# Patient Record
Sex: Male | Born: 1980 | Race: Black or African American | Hispanic: No | Marital: Single | Smoking: Current every day smoker
Health system: Southern US, Community
[De-identification: ages and names within clinical notes are randomized; demographics above are authoritative.]

---

## 2006-01-13 ENCOUNTER — Emergency Department (HOSPITAL_COMMUNITY): Admission: EM | Admit: 2006-01-13 | Discharge: 2006-01-13 | Payer: Self-pay | Admitting: Emergency Medicine

## 2006-02-17 ENCOUNTER — Emergency Department (HOSPITAL_COMMUNITY): Admission: EM | Admit: 2006-02-17 | Discharge: 2006-02-17 | Payer: Self-pay | Admitting: Family Medicine

## 2007-02-20 ENCOUNTER — Emergency Department (HOSPITAL_COMMUNITY): Admission: EM | Admit: 2007-02-20 | Discharge: 2007-02-20 | Payer: Self-pay | Admitting: Emergency Medicine

## 2008-05-03 ENCOUNTER — Emergency Department (HOSPITAL_COMMUNITY): Admission: EM | Admit: 2008-05-03 | Discharge: 2008-05-03 | Payer: Self-pay | Admitting: Family Medicine

## 2010-05-17 LAB — GC/CHLAMYDIA PROBE AMP, GENITAL: Chlamydia, DNA Probe: NEGATIVE

## 2011-06-27 ENCOUNTER — Emergency Department (HOSPITAL_BASED_OUTPATIENT_CLINIC_OR_DEPARTMENT_OTHER): Payer: BC Managed Care – PPO

## 2011-06-27 ENCOUNTER — Encounter (HOSPITAL_BASED_OUTPATIENT_CLINIC_OR_DEPARTMENT_OTHER): Payer: Self-pay | Admitting: *Deleted

## 2011-06-27 ENCOUNTER — Emergency Department (HOSPITAL_BASED_OUTPATIENT_CLINIC_OR_DEPARTMENT_OTHER)
Admission: EM | Admit: 2011-06-27 | Discharge: 2011-06-27 | Disposition: A | Discharge status: Home / Self Care | Payer: BC Managed Care – PPO | Attending: Emergency Medicine | Admitting: Emergency Medicine

## 2011-06-27 DIAGNOSIS — R0789 Other chest pain: Secondary | ICD-10-CM

## 2011-06-27 DIAGNOSIS — F17200 Nicotine dependence, unspecified, uncomplicated: Secondary | ICD-10-CM | POA: Insufficient documentation

## 2011-06-27 DIAGNOSIS — R079 Chest pain, unspecified: Secondary | ICD-10-CM | POA: Insufficient documentation

## 2011-06-27 DIAGNOSIS — J40 Bronchitis, not specified as acute or chronic: Secondary | ICD-10-CM

## 2011-06-27 DIAGNOSIS — R062 Wheezing: Secondary | ICD-10-CM | POA: Insufficient documentation

## 2011-06-27 LAB — BASIC METABOLIC PANEL
BUN: 19 mg/dL (ref 6–23)
CO2: 25 mEq/L (ref 19–32)
Chloride: 98 mEq/L (ref 96–112)
GFR calc non Af Amer: >90 mL/min (ref >90)
Glucose, Bld: 89 mg/dL (ref 70–99)
Potassium: 3.8 mEq/L (ref 3.5–5.1)
Sodium: 135 mEq/L (ref 135–145)

## 2011-06-27 LAB — DIFFERENTIAL
Basophils Relative: 0 % (ref 0–1)
Eosinophils Relative: 1 % (ref 0–5)
Lymphs Abs: 2.1 10*3/uL (ref 0.7–4.0)
Monocytes Absolute: 0.7 10*3/uL (ref 0.1–1.0)
Monocytes Relative: 9 % (ref 3–12)
Neutro Abs: 4.5 10*3/uL (ref 1.7–7.7)

## 2011-06-27 LAB — CBC
HCT: 40 % (ref 39.0–52.0)
MCH: 23.9 pg — ABNORMAL LOW (ref 26.0–34.0)
MCV: 68.3 fL — ABNORMAL LOW (ref 78.0–100.0)
Platelets: 176 10*3/uL (ref 150–400)
RBC: 5.86 MIL/uL — ABNORMAL HIGH (ref 4.22–5.81)
WBC: 7.4 10*3/uL (ref 4.0–10.5)

## 2011-06-27 MED ORDER — ALBUTEROL SULFATE (5 MG/ML) 0.5% IN NEBU
5.0000 mg | INHALATION_SOLUTION | Freq: Once | RESPIRATORY_TRACT | Status: AC
  Administered 2011-06-27: 5 mg via RESPIRATORY_TRACT
  Filled 2011-06-27: qty 1

## 2011-06-27 MED ORDER — KETOROLAC TROMETHAMINE 30 MG/ML IJ SOLN
30.0000 mg | Freq: Once | INTRAMUSCULAR | Status: AC
  Administered 2011-06-27: 30 mg via INTRAVENOUS
  Filled 2011-06-27: qty 1

## 2011-06-27 MED ORDER — IBUPROFEN 600 MG PO TABS: 600.0000 mg | ORAL_TABLET | Freq: Four times a day (QID) | ORAL | Status: AC | PRN

## 2011-06-27 MED ORDER — ALBUTEROL SULFATE HFA 108 (90 BASE) MCG/ACT IN AERS: 1.0000 | INHALATION_SPRAY | Freq: Four times a day (QID) | RESPIRATORY_TRACT | Status: DC | PRN

## 2011-06-27 NOTE — Discharge Instructions (Signed)

## 2011-06-27 NOTE — ED Provider Notes (Signed)
History     CSN: [id number]  Arrival date & time 06/27/11  0010   First MD Initiated Contact with Patient 06/27/11 0031      Chief Complaint  Patient presents with  . Chest Pain  . Back Pain    (Consider location/radiation/quality/duration/timing/severity/associated sxs/prior treatment) Patient is a 31 y.o. male presenting with chest pain. The history is provided by the patient.  Chest Pain The chest pain began 2 days ago. Chest pain occurs constantly. The chest pain is unchanged. The pain is associated with lifting. Quality: spasmotic. The pain does not radiate. Primary symptoms include wheezing. Pertinent negatives for primary symptoms include no palpitations.  Pertinent negatives for associated symptoms include no claudication, no diaphoresis and no lower extremity edema. He tried nothing for the symptoms. Risk factors include male gender.  Pertinent negatives for past medical history include no Marfan's syndrome.  Pertinent negatives for family medical history include: no Marfan's syndrome in family.  Procedure history is negative for cardiac catheterization.   Lifts for work and drinks lots of energy drinks and mountain dew.  Worse with lifting.  No DOE.  No n/v/d.    History reviewed. No pertinent past medical history.  History reviewed. No pertinent past surgical history.  History reviewed. No pertinent family history.  History  Substance Use Topics  . Smoking status: Current Everyday Smoker  . Smokeless tobacco: Not on file  . Alcohol Use: No      Review of Systems  Constitutional: Negative for diaphoresis.  Respiratory: Positive for wheezing.   Cardiovascular: Negative for palpitations, claudication and leg swelling.  All other systems reviewed and are negative.    Allergies  Review of patient's allergies indicates no known allergies.  Home Medications  No current outpatient prescriptions on file.  BP 147/95  Pulse 61  Temp(Src) 97.9 F (36.6 C)  (Oral)  Resp 18  Ht 5\' 9"  (1.753 m)  Wt 160 lb (72.576 kg)  BMI 23.63 kg/m2  SpO2 100%  Physical Exam  Constitutional: He is oriented to person, place, and time. He appears well-developed and well-nourished.  HENT:  Head: Normocephalic and atraumatic.  Mouth/Throat: Oropharynx is clear and moist.  Eyes: Conjunctivae are normal. Pupils are equal, round, and reactive to light.  Neck: Normal range of motion. Neck supple.  Cardiovascular: Normal rate and regular rhythm.   Pulmonary/Chest: He has wheezes. He exhibits no tenderness.  Abdominal: Soft. There is no tenderness.  Musculoskeletal: Normal range of motion. He exhibits no edema.       Negative NEER tests of B shoulders but pushing on shoulders replicates pain  Neurological: He is alert and oriented to person, place, and time. He has normal reflexes.  Skin: Skin is warm and dry.  Psychiatric: He has a normal mood and affect.    ED Course  Procedures (including critical care time)   Labs Reviewed  CBC  DIFFERENTIAL  BASIC METABOLIC PANEL  TROPONIN I   No results found.   No diagnosis found.    MDM  PERC negative.  One troponin is sufficient to r/o ACS in setting of > 8 hrs of pain.  Pain is typical of muscle spasms.  Likely due to to his type of work.      Date: 06/27/2011  Rate: 58  Rhythm: normal sinus rhythm  QRS Axis: right  Intervals: normal  ST/T Wave abnormalities: normal  Conduction Disutrbances:none  Narrative Interpretation:   Old EKG Reviewed: none available     Stop smoking, no energy  drinks, take all medication on full stomach.  Return for wheezing cough, fevers, chest pain or any concerns.  Follow up with your PMD.  Patient verbalizes understanding and agrees to follow up  Domanique Luckett Smitty Cords, MD 06/27/11 [id number]

## 2011-06-27 NOTE — ED Notes (Signed)
Pt c/o heavy lifting at work with left side chest wall pain and bil shoulder paina and back pain

## 2014-12-21 ENCOUNTER — Encounter (HOSPITAL_COMMUNITY): Payer: Self-pay | Admitting: Emergency Medicine

## 2014-12-21 ENCOUNTER — Emergency Department (HOSPITAL_COMMUNITY): Payer: Self-pay

## 2014-12-21 ENCOUNTER — Emergency Department (HOSPITAL_COMMUNITY)
Admission: EM | Admit: 2014-12-21 | Discharge: 2014-12-21 | Disposition: A | Discharge status: Home / Self Care | Payer: Self-pay | Attending: Emergency Medicine | Admitting: Emergency Medicine

## 2014-12-21 DIAGNOSIS — S199XXA Unspecified injury of neck, initial encounter: Secondary | ICD-10-CM | POA: Insufficient documentation

## 2014-12-21 DIAGNOSIS — F10920 Alcohol use, unspecified with intoxication, uncomplicated: Secondary | ICD-10-CM

## 2014-12-21 DIAGNOSIS — Y92410 Unspecified street and highway as the place of occurrence of the external cause: Secondary | ICD-10-CM | POA: Insufficient documentation

## 2014-12-21 DIAGNOSIS — Z79899 Other long term (current) drug therapy: Secondary | ICD-10-CM | POA: Insufficient documentation

## 2014-12-21 DIAGNOSIS — F17200 Nicotine dependence, unspecified, uncomplicated: Secondary | ICD-10-CM | POA: Insufficient documentation

## 2014-12-21 DIAGNOSIS — S20229A Contusion of unspecified back wall of thorax, initial encounter: Secondary | ICD-10-CM | POA: Insufficient documentation

## 2014-12-21 DIAGNOSIS — Y998 Other external cause status: Secondary | ICD-10-CM | POA: Insufficient documentation

## 2014-12-21 DIAGNOSIS — F10120 Alcohol abuse with intoxication, uncomplicated: Secondary | ICD-10-CM | POA: Insufficient documentation

## 2014-12-21 DIAGNOSIS — Y9389 Activity, other specified: Secondary | ICD-10-CM | POA: Insufficient documentation

## 2014-12-21 LAB — SAMPLE TO BLOOD BANK

## 2014-12-21 LAB — COMPREHENSIVE METABOLIC PANEL
ALT: 27 U/L (ref 17–63)
AST: 35 U/L (ref 15–41)
Albumin: 4.1 g/dL (ref 3.5–5.0)
Alkaline Phosphatase: 86 U/L (ref 38–126)
Anion gap: 10 (ref 5–15)
BILIRUBIN TOTAL: 0.6 mg/dL (ref 0.3–1.2)
BUN: 5 mg/dL — AB (ref 6–20)
CO2: 26 mmol/L (ref 22–32)
CREATININE: 1.2 mg/dL (ref 0.61–1.24)
Calcium: 9 mg/dL (ref 8.9–10.3)
Chloride: 102 mmol/L (ref 101–111)
Glucose, Bld: 134 mg/dL — ABNORMAL HIGH (ref 65–99)
POTASSIUM: 3.3 mmol/L — AB (ref 3.5–5.1)
Sodium: 138 mmol/L (ref 135–145)
TOTAL PROTEIN: 7 g/dL (ref 6.5–8.1)

## 2014-12-21 LAB — CBC
HCT: 42.7 % (ref 39.0–52.0)
Hemoglobin: 13.9 g/dL (ref 13.0–17.0)
MCH: 25.1 pg — ABNORMAL LOW (ref 26.0–34.0)
MCHC: 32.6 g/dL (ref 30.0–36.0)
MCV: 77.2 fL — ABNORMAL LOW (ref 78.0–100.0)
PLATELETS: 173 10*3/uL (ref 150–400)
RBC: 5.53 MIL/uL (ref 4.22–5.81)
RDW: 15.6 % — AB (ref 11.5–15.5)
WBC: 6.9 10*3/uL (ref 4.0–10.5)

## 2014-12-21 LAB — ETHANOL: ALCOHOL ETHYL (B): 221 mg/dL — AB (ref <5)

## 2014-12-21 MED ORDER — TRAMADOL HCL 50 MG PO TABS: 50.0000 mg | ORAL_TABLET | Freq: Four times a day (QID) | ORAL | Status: DC | PRN

## 2014-12-21 MED ORDER — IOHEXOL 300 MG/ML  SOLN
100.0000 mL | Freq: Once | INTRAMUSCULAR | Status: AC | PRN
Start: 2014-12-21 — End: 2014-12-21
  Administered 2014-12-21: 100 mL via INTRAVENOUS

## 2014-12-21 NOTE — ED Notes (Signed)
Pt refusing to wear c-collar. Pt took collar off.
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
## 2014-12-21 NOTE — ED Notes (Signed)
Pt left with all his belongings and ambulated out of the treatment area.  
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
## 2017-06-02 ENCOUNTER — Ambulatory Visit: Payer: Self-pay

## 2017-06-02 NOTE — Telephone Encounter (Signed)
Pt calling to ask what can be done for pain for left little (pinkie) finger. On Saturday, pt was playing basketball and when he went up for a rebound, he jammed his finger against another players shoulder.  Pt stated there is edema to finger and to the left side of the left hand. Bruising is noted to the distal joint to the nailbed and mid medial finger per pt. Pain is a 3/10 when at rest and with movement pain goes to a 8/10. No breaks in the skin.  Pt has appt with Marco Collie PA tomorrow am. Care advice given per protocol.  Reason for Disposition . Finger joint can't be opened (straightened) or closed (bent) completely    (Note: injured person should be able to do this without assistance)  Answer Assessment - Initial Assessment Questions 1. MECHANISM: "How did the injury happen?"      Playing basketball went up for a rebound and felt like he jammed it against a players shoulder- feels like he broke the distal bone on his pinky finger 2. ONSET: "When did the injury happen?" (Minutes or hours ago)  Saturday 3. LOCATION: "What part of the finger is injured?" "Is the nail damaged?"      Distal pinky figer 4. APPEARANCE of the INJURY: "What does the injury look like?"      Bruise (purplish)mid finger, discoloration or bruise to finger nail 5. SEVERITY: "Can you use the hand normally?"  "Can you bend your fingers into a ball and then fully open them?"cannot use the hand normally and cannot bend finger into a ball      6. SIZE: For cuts, bruises, or swelling, ask: "How large is it?" (e.g., inches or centimeters;  entire finger)      Entire pinky finger is swollen and down to the wrist 7. PAIN: "Is there pain?" If so, ask: "How bad is the pain?"    (e.g., Scale 1-10; or mild, moderate, severe)     Yes pain is 8/10 if hitting on the right spot at rest is a 3/10 8. TETANUS: For any breaks in the skin, ask: "When was the last tetanus booster?" No breaks in skin 9. OTHER SYMPTOMS: "Do you have any  other symptoms?"     no 10. PREGNANCY: "Is there any chance you are pregnant?" "When was your last menstrual period?"       n/a  Protocols used: FINGER INJURY-A-AH

## 2017-06-03 ENCOUNTER — Other Ambulatory Visit: Payer: Self-pay

## 2017-06-03 ENCOUNTER — Ambulatory Visit (INDEPENDENT_AMBULATORY_CARE_PROVIDER_SITE_OTHER): Payer: Managed Care, Other (non HMO) | Admitting: Physician Assistant

## 2017-06-03 ENCOUNTER — Ambulatory Visit (INDEPENDENT_AMBULATORY_CARE_PROVIDER_SITE_OTHER): Payer: Managed Care, Other (non HMO)

## 2017-06-03 ENCOUNTER — Encounter: Payer: Self-pay | Admitting: Physician Assistant

## 2017-06-03 VITALS — BP 112/70 | HR 57 | Temp 98.0°F | Resp 16 | Ht 69.69 in | Wt 160.0 lb

## 2017-06-03 DIAGNOSIS — M79645 Pain in left finger(s): Secondary | ICD-10-CM

## 2017-06-03 MED ORDER — MELOXICAM 15 MG PO TABS: 15.0000 mg | ORAL_TABLET | Freq: Every day | ORAL | 1 refills | Status: AC

## 2017-06-03 NOTE — Patient Instructions (Addendum)
  Your x-ray does not show a fracture.  Keep the splint on your finger for the next 2 weeks.  Take Meloxicam once daily. This is an NSIAD. Do not use with any other otc pain medication other than tylenol/acetaminophen - so no aleve, ibuprofen, motrin, advil, etc. Rest and elevate the affected painful area.  Apply cold compresses intermittently as needed.  As pain recedes, begin normal activities slowly as tolerated.   You will receive a phone call to schedule an appointment with sports medicine - they will be able to see if there is an injury to your ligaments. Please make this appointment when you get the call.   Come back and see me in 3-4 weeks for follow-up and return to work note.   Thank you for coming in today. I hope you feel we met your needs.  Feel free to call PCP if you have any questions or further requests.  Please consider signing up for MyChart if you do not already have it, as this is a great way to communicate with me.  Best,  Whitney McVey, PA-C  IF you received an x-ray today, you will receive an invoice from [facility]. Please contact [facility] at [phone] with questions or concerns regarding your invoice.   IF you received labwork today, you will receive an invoice from [place]. Please contact [facility] at [phone] with questions or concerns regarding your invoice.   Our billing staff will not be able to assist you with questions regarding bills from these companies.  You will be contacted with the lab results as soon as they are available. The fastest way to get your results is to activate your My Chart account. Instructions are located on the last page of this paperwork. If you have not heard from [place] regarding the results in 2 weeks, please contact this office.

## 2017-06-03 NOTE — Progress Notes (Signed)
   MICHARL HELMES  MRN: [id number] DOB: 1980-02-17  PCP: Patient, No Pcp Per  Subjective:  Pt is a 37 year old male who presents to clinic for finger pain x 3 days. He hurt his left pinky while playing basketball and jammed it into something. Pain is of the distal joint    Review of Systems  Musculoskeletal: Positive for arthralgias (finger pain) and joint swelling.  Skin: Positive for color change.  Neurological: Negative for weakness and numbness.    There are no active problems to display for this patient.   No current outpatient medications on file prior to visit.   No current facility-administered medications on file prior to visit.     No Known Allergies   Objective:  BP 112/70   Pulse (!) 57   Temp 98 F (36.7 C) (Oral)   Resp 16   Ht 5' 9.69" (1.77 m)   Wt 160 lb (72.6 kg)   SpO2 100%   BMI 23.17 kg/m   Physical Exam  Constitutional: He is oriented to person, place, and time. He appears well-developed and well-nourished.  Musculoskeletal:       Hands: Neurological: He is alert and oriented to person, place, and time. No sensory deficit.  Skin: Skin is warm and dry.  Psychiatric: He has a normal mood and affect. His behavior is normal. Judgment and thought content normal.  Vitals reviewed.   Dg Finger Little Left  Result Date: 06/03/2017 CLINICAL DATA:  Pain in the DIP joint for the past 3 days. History of a jamming injury. EXAM: LEFT LITTLE FINGER 2+V COMPARISON:  None in PACs FINDINGS: The left fifth finger is subjectively adequately mineralized. There is no acute or healing fracture. The joint spaces are reasonably well-maintained. There is a well-marginated erosion or subcortical cyst along the dorso lateral aspect of the base of the proximal phalanx. IMPRESSION: There is no acute post traumatic injury of the fifth finger. Electronically Signed   By: David  [place] M.D.   On: 06/03/2017 11:00    Assessment and Plan :  1. Pain of finger of left hand - DG  Finger Little Left; Future - Ambulatory referral to Sports Medicine - meloxicam (MOBIC) 15 MG tablet; Take 1 tablet (15 mg total) by mouth daily.  Dispense: 30 tablet; Refill: 1 - pt presents with pain and swelling of DIP of 5th finger s/p jamming injury. X-ray is negative. Suspect possible mild mallet finger. Plan to splint x 3-4 weeks. Splint applied by CMA immobilizing DIP and PIP joint. Advised PRICE principles. 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  F/u in 3-4 weeks.   Marco Collie, PA-C  Primary Care at [facility] 06/03/2017 10:43 AM

## 2017-06-06 ENCOUNTER — Encounter: Payer: Self-pay | Admitting: General Practice

## 2018-12-23 ENCOUNTER — Emergency Department (HOSPITAL_COMMUNITY)
Admission: EM | Admit: 2018-12-23 | Discharge: 2018-12-23 | Disposition: A | Discharge status: Home / Self Care | Payer: Self-pay | Attending: Emergency Medicine | Admitting: Emergency Medicine

## 2018-12-23 ENCOUNTER — Encounter (HOSPITAL_COMMUNITY): Payer: Self-pay

## 2018-12-23 DIAGNOSIS — R112 Nausea with vomiting, unspecified: Secondary | ICD-10-CM | POA: Insufficient documentation

## 2018-12-23 DIAGNOSIS — F17210 Nicotine dependence, cigarettes, uncomplicated: Secondary | ICD-10-CM | POA: Insufficient documentation

## 2018-12-23 DIAGNOSIS — R5383 Other fatigue: Secondary | ICD-10-CM | POA: Insufficient documentation

## 2018-12-23 DIAGNOSIS — F1210 Cannabis abuse, uncomplicated: Secondary | ICD-10-CM | POA: Insufficient documentation

## 2018-12-23 LAB — CBG MONITORING, ED: Glucose-Capillary: 97 mg/dL (ref 70–99)

## 2018-12-23 MED ORDER — ONDANSETRON 4 MG PO TBDP: 4.0000 mg | ORAL_TABLET | Freq: Three times a day (TID) | ORAL | 0 refills | Status: AC | PRN

## 2018-12-23 NOTE — ED Provider Notes (Signed)
[facility]-EMERGENCY DEPT Provider Note   CSN: [id number] Arrival date & time: 12/23/18  1036     History   Chief Complaint Chief Complaint  Patient presents with  . Emesis  . Fatigue    HPI Alexander Stevenson is a 38 y.o. male with no past medical history who presents to the ED after 1 episode of non-bloody, non-bilious emesis that occurred yesterday after eating [employer]. Patient notes he has needed "fluid shots" before for dehydration. Patient states he feels "weak on his stomach due to vomiting yesterday" but has no other complaints. He uses energy drinks daily for caffeine and notes he has had 2 energy drinks today. Patient was able to eat half a sandwich today without any difficulty. He denies further episodes of emesis. Patient denies abdominal pain, diarrhea, nausea, chest pain, and shortness of breath.  History reviewed. No pertinent past medical history.  There are no active problems to display for this patient.   History reviewed. No pertinent surgical history.      Home Medications    Prior to Admission medications   Medication Sig Start Date End Date Taking? Authorizing Provider  meloxicam (MOBIC) 15 MG tablet Take 1 tablet (15 mg total) by mouth daily. 06/03/17   McVey, Madelaine Bhat, PA-C  ondansetron (ZOFRAN ODT) 4 MG disintegrating tablet Take 1 tablet (4 mg total) by mouth every 8 (eight) hours as needed for nausea or vomiting. 12/23/18   Renee Harder, PA-C    Family History No family history on file.  Social History Social History   Tobacco Use  . Smoking status: Current Every Day Smoker  . Smokeless tobacco: Never Used  Substance Use Topics  . Alcohol use: No  . Drug use: Yes    Types: Marijuana     Allergies   Patient has no known allergies.   Review of Systems Review of Systems  Constitutional: Negative for chills and fever.  Respiratory: Negative for cough and shortness of breath.   Cardiovascular: Negative for  chest pain.  Gastrointestinal: Positive for vomiting (1 episode yesterday). Negative for abdominal pain, diarrhea and nausea.     Physical Exam Updated Vital Signs BP (!) 148/61 (BP Location: Left Arm)   Pulse (!) 58   Temp 98.2 F (36.8 C) (Oral)   Resp 17   SpO2 99%   Physical Exam Vitals signs and nursing note reviewed.  Constitutional:      General: He is not in acute distress.    Appearance: He is not ill-appearing.  HENT:     Head: Normocephalic.     Nose: Nose normal.     Mouth/Throat:     Mouth: Mucous membranes are moist.     Pharynx: No oropharyngeal exudate or posterior oropharyngeal erythema.     Comments: Moist mucus membranes Eyes:     Conjunctiva/sclera: Conjunctivae normal.  Neck:     Musculoskeletal: Normal range of motion and neck supple.  Cardiovascular:     Rate and Rhythm: Normal rate and regular rhythm.     Pulses: Normal pulses.     Heart sounds: Normal heart sounds. No murmur. No friction rub. No gallop.   Pulmonary:     Effort: Pulmonary effort is normal.     Breath sounds: Normal breath sounds.  Abdominal:     General: Abdomen is flat. There is no distension.     Palpations: Abdomen is soft.     Tenderness: There is no abdominal tenderness. There is no guarding or  rebound.  Musculoskeletal:     Comments: Able to move all 4 extremities without difficulty  Skin:    General: Skin is warm.     Capillary Refill: Capillary refill takes less than 2 seconds.     Coloration: Skin is not pale.  Neurological:     General: No focal deficit present.     Mental Status: He is alert.      ED Treatments / Results  Labs (all labs ordered are listed, but only abnormal results are displayed) Labs Reviewed  CBG MONITORING, ED    EKG None  Radiology No results found.  Procedures Procedures (including critical care time)  Medications Ordered in ED Medications - No data to display   Initial Impression / Assessment and Plan / ED Course  I  have reviewed the triage vital signs and the nursing notes.  Pertinent labs & imaging results that were available during my care of the patient were reviewed by me and considered in my medical decision making (see chart for details).       38 year old male presents to ED after 1 episode of non-bloody, non-bilious emesis that occurred yesterday. Vitals reviewed. Patient is bradycardic at 59 which appears to be his baseline through chart review. Patient has an elevated BP of 148/61; however blood pressure cuff is over sweatshirt. Repeat BP was 138/71. Will continue to monitor. Abdomen soft, non-distended, and non-tender. Mucus membranes are moist with no signs of dehydration. Capillary refill <2. Skin turgor normal. Patient able to ambulate in ED without difficulty. Will start with po fluids and reevaluate patient.  Patient tolerates fluids without difficulty. Will discharge patient home with zofran as needed for nausea. PCP number given to patient to establish care. Strict ED precautions discussed with patient. Patient states understanding and agrees to plan. Patient discharged home in no acute distress..  Final Clinical Impressions(s) / ED Diagnoses   Final diagnoses:  Non-intractable vomiting with nausea, unspecified vomiting type  Fatigue, unspecified type    ED Discharge Orders         Ordered    ondansetron (ZOFRAN ODT) 4 MG disintegrating tablet  Every 8 hours PRN     12/23/18 1203           Jonette Eva, PA-C 12/23/18 1244    Daleen Bo, MD 12/24/18 [phone]

## 2018-12-23 NOTE — Discharge Instructions (Signed)
I am sending you home with Zofran as needed for nausea. Take as prescribed. I have also given you the number for a primary care doctor. Please call today to schedule an appointment to establish care. Continue to drink fluids throughout the day. Return to the ER for new or worsening symptoms.

## 2018-12-23 NOTE — ED Triage Notes (Signed)
Patient reports emesis last night after eating [employer] and drinking 3 energy drinks. Patient has had 2 energy drinks today.   Denies abdominal pain Denies N/V today   Patient also reports feeling weak when he does not eat. Patient reports feeling better after he eats.    [facility] in triage.
